# Patient Record
Sex: Male | Born: 1990 | Race: Black or African American | Hispanic: No | Marital: Single
Health system: Southern US, Community
[De-identification: ages and names within clinical notes are randomized; demographics above are authoritative.]

---

## 2016-06-24 ENCOUNTER — Other Ambulatory Visit: Payer: Self-pay | Admitting: Occupational Medicine

## 2016-06-24 ENCOUNTER — Ambulatory Visit
Admission: RE | Admit: 2016-06-24 | Discharge: 2016-06-24 | Disposition: A | Discharge status: Home / Self Care | Payer: No Typology Code available for payment source | Source: Ambulatory Visit | Attending: Occupational Medicine | Admitting: Occupational Medicine

## 2016-06-24 DIAGNOSIS — Z021 Encounter for pre-employment examination: Secondary | ICD-10-CM

## 2017-06-30 ENCOUNTER — Ambulatory Visit: Payer: 59 | Admitting: Internal Medicine

## 2017-06-30 ENCOUNTER — Other Ambulatory Visit: Payer: Self-pay

## 2017-06-30 VITALS — BP 119/58 | HR 83 | Temp 98.3°F | Ht 67.0 in | Wt 170.3 lb

## 2017-06-30 DIAGNOSIS — F419 Anxiety disorder, unspecified: Secondary | ICD-10-CM

## 2017-06-30 DIAGNOSIS — Z1339 Encounter for screening examination for other mental health and behavioral disorders: Secondary | ICD-10-CM | POA: Insufficient documentation

## 2017-06-30 DIAGNOSIS — F909 Attention-deficit hyperactivity disorder, unspecified type: Secondary | ICD-10-CM | POA: Diagnosis not present

## 2017-06-30 DIAGNOSIS — R079 Chest pain, unspecified: Secondary | ICD-10-CM | POA: Insufficient documentation

## 2017-06-30 DIAGNOSIS — R413 Other amnesia: Secondary | ICD-10-CM | POA: Diagnosis not present

## 2017-06-30 DIAGNOSIS — Z1389 Encounter for screening for other disorder: Principal | ICD-10-CM

## 2017-06-30 DIAGNOSIS — R0789 Other chest pain: Secondary | ICD-10-CM | POA: Diagnosis not present

## 2017-06-30 NOTE — Patient Instructions (Signed)
Thank you for visiting clinic today. I am giving you a referral to see a psychiatrist as soon as possible, they should be able to evaluate and manage your ADHD symptoms. Please follow-up in our clinic as needed.

## 2017-06-30 NOTE — Progress Notes (Signed)
   CC: To establish care and get medication for his ADHD.  HPI:  Joshua West is a 27 y.o. with past medical history significant for ADHD diagnosed by a psychologist in college in 2016.  Patient was on Vyvanse for about a year, now not taking any medication for more than a year.  He recently joined [employer] after graduating from [employer].  He was complaining of problems with long-term memory and taking longer times to complete required assignments.  According to him he is having difficulty with concentration since he was in middle school, never diagnosed with ADHD before. 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 He denies any depressive symptoms, but describes himself as being anxious especially when around people.  He was also complaining of intermittent substernal localized chest pain with no exertional component, nonradiating, not associated with palpitation, shortness of breath or with food intake.  No GERD symptoms.  No pleuritic component.  Pain starts at random and relieved on its own after 15-20 minutes.  No past medical history on file.   Family history.  Maternal grandmother has diabetes and mom has hypertension.  No family history of any psychiatric illness.  Social history.  Recently joined [employer]. Non-smoker, occasionally drinks beer, denies any illicit drug use. Sexually active with girlfriend, uses condoms regularly.  Review of Systems:  General: Denies fever, chills, fatigue, unexpected weight loss, change in appetite and diaphoresis.  Respiratory: Denies SOB, cough, DOE, chest tightness, and wheezing.   Cardiovascular: Nonspecific intermittent chest pain, no palpitation or exertional dyspnea.   Gastrointestinal: Denies nausea, vomiting, abdominal pain, diarrhea, constipation, blood in stool and abdominal distention.  Genitourinary: Denies dysuria, urgency, frequency,  hematuria, suprapubic pain and flank pain. Musculoskeletal: Denies myalgias, back pain, joint swelling, arthralgias and gait problem.  Neurological: Denies dizziness, headaches, weakness, lightheadedness, numbness, seizures, and syncope. Psychiatric/Behavioral: Anxiety and problems with long-term memory , takes longer to complete assignment because of inattention, Denies mood changes, confusion, nervousness, sleep disturbance and agitation.   Physical Exam:  Vitals:   06/30/17 1538  BP: (!) 119/58  Pulse: 83  Temp: 98.3 F (36.8 C)  TempSrc: Oral  SpO2: 100%  Weight: 170 lb 4.8 oz (77.2 kg)  Height: 5\' 7"  (1.702 m)    General: Vital signs reviewed.  Patient is well-developed and well-nourished, in no acute distress and cooperative with exam.  Head: Normocephalic and atraumatic. Eyes: EOMI, conjunctivae normal, no scleral icterus.  Neck: Supple, trachea midline, normal ROM, no JVD, masses, thyromegaly, or carotid bruit present.  Cardiovascular: RRR, S1 normal, S2 normal, no murmurs, gallops, or rubs. Pulmonary/Chest: Clear to auscultation bilaterally, no wheezes, rales, or rhonchi. Abdominal: Soft, non-tender, non-distended, BS +, no masses, organomegaly, or guarding present.  Musculoskeletal: No joint deformities, erythema, or stiffness, ROM full and nontender. Extremities: No lower extremity edema bilaterally,  pulses symmetric and intact bilaterally. No cyanosis or clubbing. Neurological: A&O x3, Strength is normal and symmetric bilaterally, cranial nerve II-XII are grossly intact, no focal motor deficit, sensory intact to light touch bilaterally.  Skin: Warm, dry and intact. No rashes or erythema. Psychiatric: Normal mood and affect. speech and behavior is normal. Cognition and memory are normal.  Assessment & Plan:   See Encounters Tab for problem based charting.  Patient discussed with Dr. Rogelia BogaButcher.

## 2017-06-30 NOTE — Assessment & Plan Note (Addendum)
ASRD-adult self-report scale screening was done in the clinic which was positive for all 6 symptoms from sometimes to often, which needs further specialized evaluation for ADHD.  He did bring documentation from his previous psychologist evaluation done in February 2016.  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  He was recommended to see a psychiatrist and have some cognitive behavioral therapy done.  Patient was given Vyvanse from his college clinic, according to him it was helping them with his symptoms and improved his attention at that time.  Patient stopped taking Vyvanse for more than a year, now feels that he should go back on that medicine to improve his attention.  As patient currently not taking Vyvanse-we will refer him to a psychiatrist for further evaluation and management.

## 2017-06-30 NOTE — Assessment & Plan Note (Signed)
He has very nonspecific symptoms of chest pain, no red flag.  Does not need any further evaluation at this time.

## 2017-07-01 NOTE — Progress Notes (Signed)
Internal Medicine Clinic Attending  Case discussed with Dr. Amin at the time of the visit.  We reviewed the resident's history and exam and pertinent patient test results.  I agree with the assessment, diagnosis, and plan of care documented in the resident's note.    

## 2017-07-21 NOTE — Progress Notes (Deleted)
Psychiatric Initial Adult Assessment   Patient Identification: Joshua BatmanChristopher West MRN:  [id number] Date of Evaluation:  07/21/2017 Referral Source: Arnetha CourserAmin, Sumayya, MD Chief Complaint:   Visit Diagnosis: No diagnosis found.  History of Present Illness:   Joshua West is a 27 y.o. year old male with a history of ADHD, who is referred for ADHD.   Per chart review, patient was done psychology evaluate in feb 2016 at [employer] [facility]. Diagnosed with ADHD predominantly inattentive.    He did bring documentation from his previous psychologist evaluation done in February 2016.  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  He was recommended to see a psychiatrist and have some cognitive behavioral therapy done.  Patient was given Vyvanse from his college clinic, according to him it was helping them with his symptoms and improved his attention at that time.  Patient stopped taking Vyvanse for more than a year, now feels that he should go back on that medicine to improve his attention.   Associated Signs/Symptoms: Depression Symptoms:  {DEPRESSION SYMPTOMS:20000} (Hypo) Manic Symptoms:  {BHH MANIC SYMPTOMS:22872} Anxiety Symptoms:  {BHH ANXIETY SYMPTOMS:22873} Psychotic Symptoms:  {BHH PSYCHOTIC SYMPTOMS:22874} PTSD Symptoms: {BHH PTSD SYMPTOMS:22875}  Past Psychiatric History:  Outpatient:  Psychiatry admission:  Previous suicide attempt:  Past trials of medication:  History of violence:   Previous Psychotropic Medications: {YES/NO:21197}  Substance Abuse History in the last 12 months:  {yes no:314532}  Consequences of Substance Abuse: {BHH CONSEQUENCES OF SUBSTANCE ABUSE:22880}  Past Medical History: No past medical history on file. *** The histories are not reviewed yet. Please review them in the "History" navigator section and refresh this SmartLink.  Family Psychiatric History: ***  Family History: No family  history on file.  Social History:   Social History   Socioeconomic History  . Marital status: Single    Spouse name: Not on file  . Number of children: Not on file  . Years of education: Not on file  . Highest education level: Not on file  Occupational History  . Not on file  Social Needs  . Financial resource strain: Not on file  . Food insecurity:    Worry: Not on file    Inability: Not on file  . Transportation needs:    Medical: Not on file    Non-medical: Not on file  Tobacco Use  . Smoking status: Not on file  Substance and Sexual Activity  . Alcohol use: Not on file  . Drug use: Not on file  . Sexual activity: Not on file  Lifestyle  . Physical activity:    Days per week: Not on file    Minutes per session: Not on file  . Stress: Not on file  Relationships  . Social connections:    Talks on phone: Not on file    Gets together: Not on file    Attends religious service: Not on file    Active member of club or organization: Not on file    Attends meetings of clubs or organizations: Not on file    Relationship status: Not on file  Other Topics Concern  . Not on file  Social History Narrative  . Not on file    Additional Social History: ***  Allergies:  Not on File  Metabolic Disorder Labs: No results found for: HGBA1C, MPG No results found for: PROLACTIN No results found for: CHOL, TRIG, HDL, CHOLHDL, VLDL, LDLCALC   Current Medications: No current outpatient medications on file.  No current facility-administered medications for this visit.     Neurologic: Headache: No Seizure: No Paresthesias:No  Musculoskeletal: Strength & Muscle Tone: within normal limits Gait & Station: normal Patient leans: N/A  Psychiatric Specialty Exam: ROS  There were no vitals taken for this visit.There is no height or weight on file to calculate BMI.  General Appearance: Fairly Groomed  Eye Contact:  Good  Speech:  Clear and Coherent  Volume:  Normal  Mood:   {BHH MOOD:22306}  Affect:  {Affect (PAA):22687}  Thought Process:  Coherent and Goal Directed  Orientation:  Full (Time, Place, and Person)  Thought Content:  Logical  Suicidal Thoughts:  {ST/HT (PAA):22692}  Homicidal Thoughts:  {ST/HT (PAA):22692}  Memory:  Immediate;   Good Recent;   Good Remote;   Good  Judgement:  {Judgement (PAA):22694}  Insight:  {Insight (PAA):22695}  Psychomotor Activity:  Normal  Concentration:  Concentration: Good and Attention Span: Good  Recall:  Good  Fund of Knowledge:Good  Language: Good  Akathisia:  No  Handed:  Right  AIMS (if indicated):  N/A  Assets:  Communication Skills Desire for Improvement  ADL's:  Intact  Cognition: WNL  Sleep:  ***   Assessment  Plan  The patient demonstrates the following risk factors for suicide: Chronic risk factors for suicide include: {Chronic Risk Factors for ZOXWRUE:45409811}. Acute risk factors for suicide include: {Acute Risk Factors for BJYNWGN:56213086}. Protective factors for this patient include: {Protective Factors for Suicide VHQI:69629528}. Considering these factors, the overall suicide risk at this point appears to be {Desc; low/moderate/high:110033}. Patient {ACTION; IS/IS UXL:24401027} appropriate for outpatient follow up.   Treatment Plan Summary: Plan as above   Neysa Hotter, MD 4/1/201910:44 AM

## 2017-07-22 ENCOUNTER — Ambulatory Visit (HOSPITAL_COMMUNITY): Payer: 59 | Admitting: Psychiatry

## 2017-07-28 NOTE — Addendum Note (Signed)
Addended by: Neomia DearPOWERS, Kirtis Challis E on: 07/28/2017 07:22 PM   Modules accepted: Orders

## 2017-11-04 DIAGNOSIS — H5203 Hypermetropia, bilateral: Secondary | ICD-10-CM | POA: Diagnosis not present

## 2017-11-04 DIAGNOSIS — H52223 Regular astigmatism, bilateral: Secondary | ICD-10-CM | POA: Diagnosis not present

## 2018-06-05 DIAGNOSIS — S5011XA Contusion of right forearm, initial encounter: Secondary | ICD-10-CM | POA: Diagnosis not present

## 2018-06-05 DIAGNOSIS — R3915 Urgency of urination: Secondary | ICD-10-CM | POA: Diagnosis not present

## 2018-06-05 DIAGNOSIS — R300 Dysuria: Secondary | ICD-10-CM | POA: Diagnosis not present

## 2019-03-04 IMAGING — CR DG CHEST 1V
1 series · 1 of 1 positions shown · non-contrast
Comparison: None.

CLINICAL DATA: 25-year-old male pre-employment examination. Initial
encounter.

EXAM:
CHEST 1 VIEW

[w chest pa]
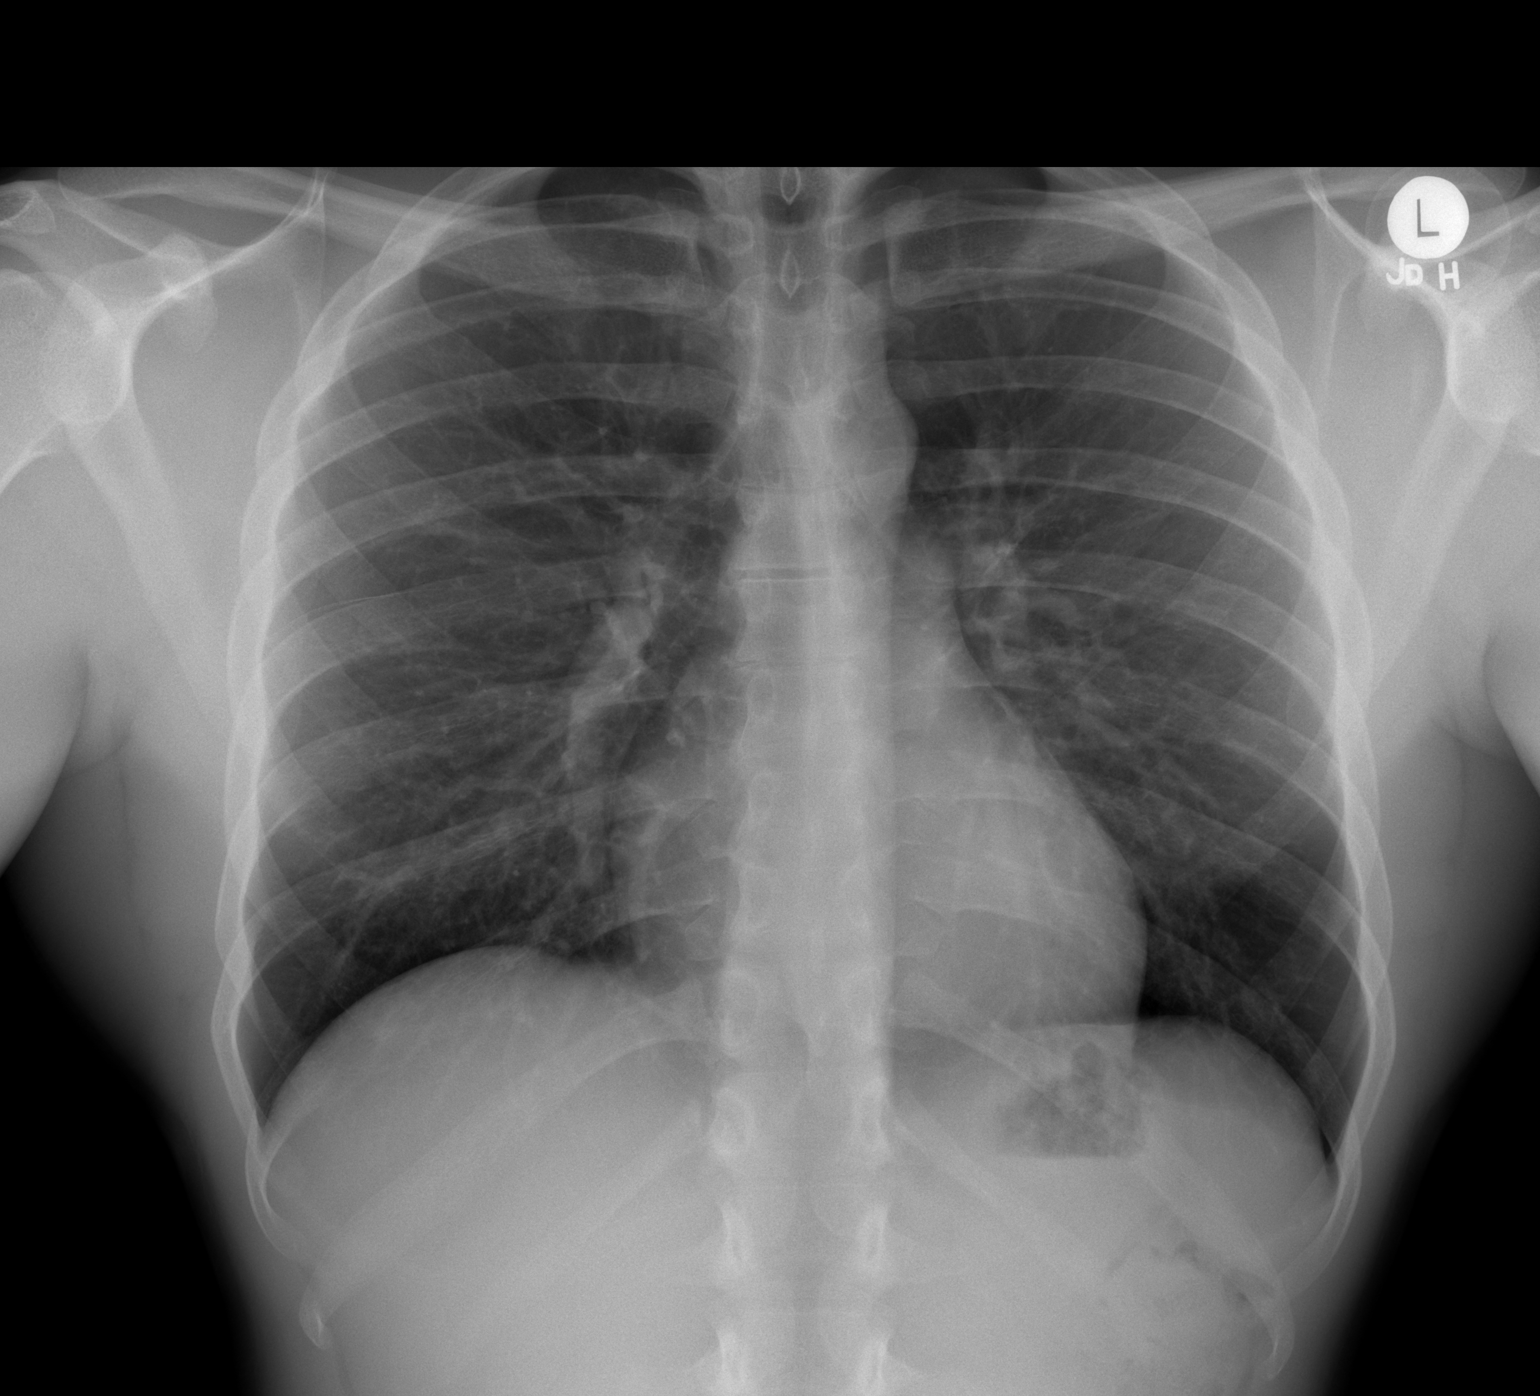

[1 of 1 positions shown; findings below may reference images not displayed]

FINDINGS: PA view of the chest. Normal lung volumes. Normal cardiac size and
mediastinal contours. Visualized tracheal air column is within
normal limits. No pneumothorax, pulmonary edema, pleural effusion or
confluent pulmonary opacity. Negative visible bowel gas pattern. No
osseous abnormality identified.
IMPRESSION: Negative.  No acute cardiopulmonary abnormality.
